# Patient Record
Sex: Female | Born: 1950 | Race: Black or African American | Hispanic: No | Marital: Married
Health system: Southern US, Community
[De-identification: ages and names within clinical notes are randomized; demographics above are authoritative.]

---

## 2007-07-18 ENCOUNTER — Emergency Department: Payer: Self-pay

## 2008-06-05 ENCOUNTER — Ambulatory Visit: Payer: Self-pay | Admitting: Internal Medicine

## 2008-07-20 ENCOUNTER — Ambulatory Visit (HOSPITAL_COMMUNITY): Admission: RE | Admit: 2008-07-20 | Discharge: 2008-07-20 | Payer: Self-pay | Admitting: Ophthalmology

## 2009-05-05 ENCOUNTER — Inpatient Hospital Stay: Payer: Self-pay | Admitting: Internal Medicine

## 2009-06-13 ENCOUNTER — Ambulatory Visit: Payer: Self-pay | Admitting: Ophthalmology

## 2009-06-20 ENCOUNTER — Ambulatory Visit: Payer: Self-pay | Admitting: Ophthalmology

## 2010-04-15 LAB — URINALYSIS, ROUTINE W REFLEX MICROSCOPIC
Bilirubin Urine: NEGATIVE
Ketones, ur: NEGATIVE mg/dL
Leukocytes, UA: NEGATIVE
Nitrite: NEGATIVE
Protein, ur: 100 mg/dL — AB
Urobilinogen, UA: 0.2 mg/dL (ref 0.0–1.0)
pH: 6.5 (ref 5.0–8.0)

## 2010-04-15 LAB — GLUCOSE, CAPILLARY: Glucose-Capillary: 85 mg/dL (ref 70–99)

## 2010-04-15 LAB — COMPREHENSIVE METABOLIC PANEL
ALT: 10 U/L (ref 0–35)
Alkaline Phosphatase: 82 U/L (ref 39–117)
CO2: 28 mEq/L (ref 19–32)
Calcium: 8.9 mg/dL (ref 8.4–10.5)
Chloride: 103 mEq/L (ref 96–112)
GFR calc non Af Amer: >60 mL/min (ref >60)
Glucose, Bld: 151 mg/dL — ABNORMAL HIGH (ref 70–99)
Sodium: 135 mEq/L (ref 135–145)
Total Bilirubin: 0.6 mg/dL (ref 0.3–1.2)

## 2010-04-15 LAB — CBC
Hemoglobin: 12.9 g/dL (ref 12.0–15.0)
MCHC: 34.5 g/dL (ref 30.0–36.0)
RBC: 4.1 MIL/uL (ref 3.87–5.11)
WBC: 7.3 10*3/uL (ref 4.0–10.5)

## 2010-05-22 NOTE — Op Note (Signed)
[name], [name]                ACCOUNT NO.:  [id number]   MEDICAL RECORD NO.:  [id number]          PATIENT TYPE:  AMB   LOCATION:  SDS                          FACILITY:  [facility]   PHYSICIAN:  Lanna Poche, M.D. DATE OF BIRTH:  Jun 25, 1950   DATE OF PROCEDURE:  07/20/2008  DATE OF DISCHARGE:  07/20/2008                               OPERATIVE REPORT   PREOPERATIVE DIAGNOSES:  Proliferative diabetic retinopathy with  preretinal fibrosis, cystic maculopathy, and tractional macular  detachment, right eye.   POSTOPERATIVE DIAGNOSES:  Proliferative diabetic retinopathy with  preretinal fibrosis, cystic maculopathy, and tractional macular  detachment, right eye.   PROCEDURE:  Pars plana vitrectomy, removal of epiretinal membranes,  peeling of internal limiting membranes, and panretinal photocoagulation,  right eye.   SURGEON:  Lanna Poche, MD   ANESTHESIA:  General endotracheal.   ESTIMATED BLOOD LOSS:  Less than 1 mL.   COMPLICATIONS:  None.   OPERATIVE NOTE:  The patient was taken to the operating room where after  induction of general anesthesia, the right eye was prepped and draped in  usual fashion.  Lid speculum was introduced.  Conjunctival peritomy  developed temporally and superonasally.  Hemostasis was obtained with  laser cautery and sclerotomies were fashioned 4 mm posterior to the  limbus at 1:30, 10:30, and 7:30.  The superior sclerotomies were plugged  and a 4-mm infusion cannula was secured at the 7:30 sclerotomy with a  temporary sutures of 7-0 Vicryl.  The tip was visually inspected and  found to be in the vitreous cavity.  Landers ring was secured with 7-0  Vicryl sutures at 3 and 9.  Plugs were removed and 30-degree prismatic  lens was applied to the surface of the eye.  Central core followed by  peripheral vitrectomy was performed.  There was ability to separate the  posterior hyaloid in the far periphery and the posterior and anterior  attachments  were severed 360 degrees.  Focal areas of pinpoint fibrosis  were encountered in the far periphery and these were removed with  vitrector without difficulty.  Magnifying flat lens was applied to the  surface of the eye.  Areas of dense preretinal fibrosis could be seen  along the superonasal and inferotemporal arcades.  There was focal  tractional detachments associated with these as well as there was small  amount of fibrosis on the optic nerve.  The fibrosis on the optic nerve  was separated from the 2 areas, and then these areas were sharply  delaminated from the surface of the retina with the [facility] curved scissors  and then the fibrosis removed.  The small amount of fibrosis on the  optic nerve was peeled off with the serrated forceps.  Inspection of the  macula showed the glistening surface of retina with cystic  decompensation of the fovea.  The MVR blade was used to make a small  incision into the internal limiting membrane.  The internal limiting  membrane peeled off the macula in several small pieces.  The instruments  were removed and eye holes plugged.  The [place] lens  and ring were  removed.  Inspection with an ophthalmoscope and scleral depression  revealed there to be no peripheral retinal breaks or tears.  The  indirect laser was then used to fill in gaps in the far peripheral  panretinal photocoagulation.  The superior sclerotomies were then closed  with 7-0 Vicryl.  The infusion was removed and preplaced suture secured.  Conjunctiva was drawn and reapproximated with interrupted running suture  6-0 plain gut.  Pressure checked with Barraquer tonometer and found lay  at 21.  Subconjunctival space was irrigated with 0.75% Marcaine followed  by  subconjunctival injection of 100 mg of ceftazidime and 10 mg of  Decadron.  Lid speculum was then removed and mixed antibiotic ointment  was applied to the surface of the eye.  An eye patch and shield was then  placed over the  patient's right eye.  The patient tolerated the  procedure well.           ______________________________  Lanna Poche, M.D.     JTH/MEDQ  D:  07/20/2008  T:  07/21/2008  Job:  [id number]

## 2011-06-10 LAB — URINALYSIS, COMPLETE
Ketone: NEGATIVE
Ph: 6 (ref 4.5–8.0)
Specific Gravity: 1.015 (ref 1.003–1.030)
Squamous Epithelial: <1
WBC UR: 1 /HPF (ref 0–5)

## 2011-06-10 LAB — COMPREHENSIVE METABOLIC PANEL
Bilirubin,Total: 0.3 mg/dL (ref 0.2–1.0)
Co2: 23 mmol/L (ref 21–32)
SGOT(AST): 26 U/L (ref 15–37)
SGPT (ALT): 10 U/L — ABNORMAL LOW
Total Protein: 5.8 g/dL — ABNORMAL LOW (ref 6.4–8.2)

## 2011-06-10 LAB — CK TOTAL AND CKMB (NOT AT ARMC): CK-MB: 2.6 ng/mL (ref 0.5–3.6)

## 2011-06-10 LAB — TROPONIN I: Troponin-I: <0.02 ng/mL

## 2011-06-10 LAB — CBC
HGB: 11.9 g/dL — ABNORMAL LOW (ref 12.0–16.0)
MCH: 28.8 pg (ref 26.0–34.0)
MCHC: 33.2 g/dL (ref 32.0–36.0)
MCV: 87 fL (ref 80–100)
Platelet: 208 10*3/uL (ref 150–440)
RDW: 14.8 % — ABNORMAL HIGH (ref 11.5–14.5)
WBC: 10.6 10*3/uL (ref 3.6–11.0)

## 2011-06-11 LAB — HEMOGLOBIN A1C: Hemoglobin A1C: 11.2 % — ABNORMAL HIGH (ref 4.2–6.3)

## 2011-06-12 ENCOUNTER — Inpatient Hospital Stay: Payer: Self-pay | Admitting: Internal Medicine

## 2011-06-13 LAB — PRO B NATRIURETIC PEPTIDE: B-Type Natriuretic Peptide: 8231 pg/mL — ABNORMAL HIGH (ref 0–125)

## 2011-06-14 LAB — CREATININE, SERUM
EGFR (African American): 51 — ABNORMAL LOW
EGFR (Non-African Amer.): 44 — ABNORMAL LOW

## 2011-06-15 LAB — BASIC METABOLIC PANEL
BUN: 12 mg/dL (ref 7–18)
Calcium, Total: 7.7 mg/dL — ABNORMAL LOW (ref 8.5–10.1)
Chloride: 100 mmol/L (ref 98–107)
Creatinine: 1.27 mg/dL (ref 0.60–1.30)
EGFR (African American): 53 — ABNORMAL LOW
EGFR (Non-African Amer.): 46 — ABNORMAL LOW
Osmolality: 282 (ref 275–301)
Potassium: 3.2 mmol/L — ABNORMAL LOW (ref 3.5–5.1)

## 2011-06-15 LAB — OCCULT BLOOD X 1 CARD TO LAB, STOOL: Occult Blood, Feces: NEGATIVE

## 2011-06-16 LAB — WBCS, STOOL

## 2011-06-17 LAB — STOOL CULTURE

## 2011-07-02 ENCOUNTER — Inpatient Hospital Stay: Payer: Self-pay | Admitting: Internal Medicine

## 2011-07-02 LAB — TROPONIN I: Troponin-I: <0.02 ng/mL

## 2011-07-02 LAB — CBC
HGB: 10.4 g/dL — ABNORMAL LOW (ref 12.0–16.0)
MCH: 28.6 pg (ref 26.0–34.0)
MCHC: 32.2 g/dL (ref 32.0–36.0)
Platelet: 276 10*3/uL (ref 150–440)
RDW: 14.6 % — ABNORMAL HIGH (ref 11.5–14.5)

## 2011-07-02 LAB — CK TOTAL AND CKMB (NOT AT ARMC)
CK, Total: 70 U/L (ref 21–215)
CK, Total: 100 U/L (ref 21–215)
CK-MB: 0.7 ng/mL (ref 0.5–3.6)
CK-MB: 0.5 ng/mL (ref 0.5–3.6)

## 2011-07-02 LAB — COMPREHENSIVE METABOLIC PANEL
Alkaline Phosphatase: 135 U/L (ref 50–136)
Bilirubin,Total: 0.3 mg/dL (ref 0.2–1.0)
Chloride: 106 mmol/L (ref 98–107)
Creatinine: 1.23 mg/dL (ref 0.60–1.30)
Glucose: 125 mg/dL — ABNORMAL HIGH (ref 65–99)
SGOT(AST): 15 U/L (ref 15–37)
SGPT (ALT): 8 U/L — ABNORMAL LOW
Total Protein: 6.8 g/dL (ref 6.4–8.2)

## 2011-07-02 LAB — PRO B NATRIURETIC PEPTIDE: B-Type Natriuretic Peptide: 3490 pg/mL — ABNORMAL HIGH (ref 0–125)

## 2011-07-03 LAB — BASIC METABOLIC PANEL
BUN: 15 mg/dL (ref 7–18)
Chloride: 104 mmol/L (ref 98–107)
Co2: 31 mmol/L (ref 21–32)
Creatinine: 1.37 mg/dL — ABNORMAL HIGH (ref 0.60–1.30)
EGFR (Non-African Amer.): 42 — ABNORMAL LOW
Osmolality: 278 (ref 275–301)
Potassium: 4.4 mmol/L (ref 3.5–5.1)
Sodium: 139 mmol/L (ref 136–145)

## 2011-10-31 ENCOUNTER — Ambulatory Visit: Payer: Self-pay | Admitting: Ophthalmology

## 2011-11-06 ENCOUNTER — Ambulatory Visit: Payer: Self-pay | Admitting: Ophthalmology

## 2011-11-06 LAB — LIPID PANEL
Cholesterol: 208 mg/dL — ABNORMAL HIGH (ref 0–200)
HDL Cholesterol: 54 mg/dL (ref 40–60)
Ldl Cholesterol, Calc: 129 mg/dL — ABNORMAL HIGH (ref 0–100)
Triglycerides: 126 mg/dL (ref 0–200)
VLDL Cholesterol, Calc: 25 mg/dL (ref 5–40)

## 2011-11-06 LAB — COMPREHENSIVE METABOLIC PANEL
Alkaline Phosphatase: 163 U/L — ABNORMAL HIGH (ref 50–136)
Anion Gap: 8 (ref 7–16)
BUN: 17 mg/dL (ref 7–18)
Bilirubin,Total: 0.5 mg/dL (ref 0.2–1.0)
Chloride: 104 mmol/L (ref 98–107)
Creatinine: 1.42 mg/dL — ABNORMAL HIGH (ref 0.60–1.30)
EGFR (African American): 46 — ABNORMAL LOW
SGPT (ALT): 16 U/L (ref 12–78)
Total Protein: 7.1 g/dL (ref 6.4–8.2)

## 2011-11-06 LAB — HEMOGLOBIN A1C: Hemoglobin A1C: 10.1 % — ABNORMAL HIGH (ref 4.2–6.3)

## 2011-11-06 LAB — TSH: Thyroid Stimulating Horm: 0.343 u[IU]/mL — ABNORMAL LOW

## 2012-02-05 ENCOUNTER — Ambulatory Visit: Payer: Self-pay | Admitting: Ophthalmology

## 2012-06-05 ENCOUNTER — Ambulatory Visit: Payer: Self-pay | Admitting: Ophthalmology

## 2012-06-05 DIAGNOSIS — Z01810 Encounter for preprocedural cardiovascular examination: Secondary | ICD-10-CM

## 2012-06-05 LAB — HEMOGLOBIN: HGB: 8.7 g/dL — ABNORMAL LOW (ref 12.0–16.0)

## 2012-06-10 ENCOUNTER — Ambulatory Visit: Payer: Self-pay | Admitting: Ophthalmology

## 2012-06-10 LAB — POTASSIUM: Potassium: 5.8 mmol/L — ABNORMAL HIGH (ref 3.5–5.1)

## 2012-06-15 ENCOUNTER — Ambulatory Visit: Payer: Self-pay | Admitting: Ophthalmology

## 2012-09-10 ENCOUNTER — Ambulatory Visit: Payer: Self-pay | Admitting: Internal Medicine

## 2012-10-06 ENCOUNTER — Ambulatory Visit: Payer: Self-pay | Admitting: Internal Medicine

## 2012-10-06 LAB — CBC CANCER CENTER
Eosinophil #: 0.2 x10 3/mm (ref 0.0–0.7)
HGB: 9.7 g/dL — ABNORMAL LOW (ref 12.0–16.0)
Lymphocyte %: 24.1 %
MCHC: 33.3 g/dL (ref 32.0–36.0)
MCV: 90 fL (ref 80–100)
Monocyte #: 0.5 x10 3/mm (ref 0.2–0.9)
Monocyte %: 6 %
Neutrophil #: 5.7 x10 3/mm (ref 1.4–6.5)
Neutrophil %: 66.6 %
RBC: 3.23 10*6/uL — ABNORMAL LOW (ref 3.80–5.20)
RDW: 12.7 % (ref 11.5–14.5)
WBC: 8.6 x10 3/mm (ref 3.6–11.0)

## 2012-10-06 LAB — IRON AND TIBC
Iron Bind.Cap.(Total): 280 ug/dL (ref 250–450)
Iron: 67 ug/dL (ref 50–170)
Unbound Iron-Bind.Cap.: 213 ug/dL

## 2012-10-06 LAB — RETICULOCYTES
Absolute Retic Count: 0.0672 10*6/uL (ref 0.019–0.186)
Reticulocyte: 2.06 % (ref 0.4–3.1)

## 2012-10-06 LAB — CREATININE, SERUM: EGFR (African American): 26 — ABNORMAL LOW

## 2012-10-07 ENCOUNTER — Ambulatory Visit: Payer: Self-pay | Admitting: Internal Medicine

## 2012-10-07 LAB — KAPPA/LAMBDA FREE LIGHT CHAINS (ARMC)

## 2012-10-07 LAB — TSH: Thyroid Stimulating Horm: 1.04 u[IU]/mL

## 2012-10-13 LAB — OCCULT BLOOD X 1 CARD TO LAB, STOOL
Occult Blood, Feces: NEGATIVE
Occult Blood, Feces: NEGATIVE
Occult Blood, Feces: NEGATIVE

## 2012-11-07 ENCOUNTER — Ambulatory Visit: Payer: Self-pay | Admitting: Internal Medicine

## 2012-11-10 LAB — CBC CANCER CENTER
Basophil #: 0 x10 3/mm (ref 0.0–0.1)
Eosinophil %: 2 %
HCT: 29.3 % — ABNORMAL LOW (ref 35.0–47.0)
Lymphocyte #: 1.6 x10 3/mm (ref 1.0–3.6)
Lymphocyte %: 20.6 %
MCH: 30.6 pg (ref 26.0–34.0)
MCHC: 33.6 g/dL (ref 32.0–36.0)
MCV: 91 fL (ref 80–100)
Monocyte #: 0.5 x10 3/mm (ref 0.2–0.9)
Monocyte %: 6.7 %
Neutrophil #: 5.6 x10 3/mm (ref 1.4–6.5)
Neutrophil %: 70.2 %
Platelet: 199 x10 3/mm (ref 150–440)
RBC: 3.22 10*6/uL — ABNORMAL LOW (ref 3.80–5.20)
RDW: 12.5 % (ref 11.5–14.5)

## 2012-11-10 LAB — CREATININE, SERUM: EGFR (Non-African Amer.): 21 — ABNORMAL LOW

## 2012-12-07 ENCOUNTER — Ambulatory Visit: Payer: Self-pay | Admitting: Internal Medicine

## 2013-01-07 DEATH — deceased

## 2014-04-26 NOTE — Op Note (Signed)
PATIENT NAME:  [name], [name] MR#:  [id number] DATE OF BIRTH:  [birth year]  DATE OF PROCEDURE:  11/06/2011  PROCEDURES PERFORMED:  1. Pars plana vitrectomy, left eye.  2. Phacoemulsification and intraocular lens insertion in the left eye.  3. Tractional retinal detachment repair, left eye.  4. Endolaser, left eye.   PREOPERATIVE DIAGNOSES:  1. Dense vitreous hemorrhage.  2. Proliferative diabetic retinopathy.  3. Visually significant cataract.   POSTOPERATIVE DIAGNOSES:  1. Dense vitreous hemorrhage.  2. Proliferative diabetic retinopathy.  3. Visually significant cataract.  4. Tractional retinal detachment   PRIMARY SURGEON: Ignacia Felling. Dakiyah Heinke, MD  ANESTHESIA: Retrobulbar block of the right eye with general endotracheal anesthesia.   COMPLICATIONS: None.   INDICATIONS FOR PROCEDURE: This is a patient who presented to my office with loss of vision in her left eye. Examination revealed a dense hemorrhage in the preretinal area trapped by the vitreous face. Examination of the posterior pole was not possible secondary to the blood. Given that this is the patient's only eye and the very severe loss of vision, risks, benefits, and alternatives of the above procedure were discussed and the patient wished to proceed.   DETAILS OF PROCEDURE: After informed consent was obtained the patient was brought into the operative suite at [facility]. Patient was induced by the anesthesia team without complications and a retrobulbar block was performed on the left eye by the primary surgeon without complication. Left eye was prepped and draped in sterile manner. After lid speculum was inserted, a side-port wound was created at approximately 10:30. DisCoVisc was injected into the anterior chamber to maintain it. Keratome blade was used to create a main corneal wound at approximately 12:00. The anterior capsule was incised with the cystotome and a continuous 360 degree anterior  capsulorrhexis was created without complication. The lens hydrodissected using BSS on a 26-gauge cannula. Lens was rotated for 180 degrees. Phacoemulsification wand was used to break the lens into four quadrants and removed without complication. INA was used to remove any remnant cortical material. The posterior capsule was polished. DisCoVisc was injected into the capsular bag. A 23.5-diopter SN60WF lens, serial #[id number] was injected into the capsular bag and rotated into position. DisCoVisc was removed using INA. A 10-0 stitch was placed at the main corneal wound and rotated into position. The side-port wound was hydrated and the anterior chamber was noted to be filled. The wounds were noted to be watertight and attention was turned to the pars plana vitrectomy portion of the case.   A 25-gauge trocar was placed inferotemporally through displaced conjunctiva in an oblique fashion 3 mm beyond the limbus. The infusion cannula was turned on and inserted through the trocar and secured into position with Steri-Strips. Two more trocars were placed superotemporally and superonasally. The vitreous cutter and light pipe were introduced in the eye and a core vitrectomy was performed. Peripheral vitreous was incised and removed for 360 degrees in order to relieve any anterior-posterior traction. Attention was turned to the posterior portion of the eye. The posterior vitreous face was incised and blood was removed. After this significant proliferative membranes were identified with associated tractional retinal detachments approaching the macula. Each of the proliferative membranes were isolated from one another using a combination of forceps and 25-gauge vitrector. No breaks were created during the course of isolation of each of the membranes. As much of the membranes were removed as safely possible. Once all traction had been relieved, any remnant blood was removed. The  retina was investigated for 360 degrees and no  signs of any breaks or tears could be identified anywhere. Endolaser was introduced and gap laser was performed for 360 degrees in a panretinal fashion. Scleral depression was then performed and no further breaks could identify peripherally. Remnant blood in the inferior was removed as much as possible using scleral depression and direct visualization. An air-fluid exchange was then performed. The trocars were removed and the wounds were noted to be airtight. Pressure in the eye was confirmed to be approximately 15 mmHg. 5 mg of dexamethasone was given into the inferior fornix and the lid speculum was removed. The eye was cleaned and TobraDex was placed in the eye. A patch and shield were placed over the eye and the patient was taken to postanesthesia care with instructions to remain head up.  ____________________________ Ignacia FellingMatthew F. Kayah Hecker, MD mfa:cms D: 11/06/2011 10:34:29 ET T: 11/06/2011 10:57:02 ET  JOB#: [id number] Cline CoolsMATTHEW F Laquinn Shippy MD ELECTRONICALLY SIGNED 12/11/2011 6:55

## 2014-04-29 NOTE — Op Note (Signed)
PATIENT NAME:  Ladonna SnideLSTON, Tiawanna L MR#:  [id number] DATE OF BIRTH:  [birth year]  DATE OF PROCEDURE:  02/05/2012  PROCEDURES PERFORMED:  1. Pars plana vitrectomy of the left eye.  2. Membrane peel of the left eye.  3. Ahmed valve insertion of the left eye.   PREOPERATIVE DIAGNOSES:  1. Nonclearing vitreous hemorrhage.  2. Uncontrolled glaucoma of the left eye.   PRIMARY SURGEON:  1. For pars plana vitrectomy and membrane peel, Ignacia FellingMatthew F. Chantele Corado, MD. 2. For Ahmed valve insertion, Deirdre Evenerhadwick R. Brasington, MD.   ESTIMATED BLOOD LOSS: 1 mL.   COMPLICATIONS: None.   INDICATIONS FOR PROCEDURE: This patient presented to my office several weeks after vitrectomy surgery for proliferative diabetic retinopathy. The patient had repeat vitreous hemorrhage and uncontrolled glaucoma secondary to this. Risks, benefits and alternatives of the above procedure were discussed, and the patient wished proceed.   DETAILS OF PROCEDURE: After informed consent was obtained, the patient brought to the operative suite at [facility]. The patient was placed in supine position and was given a small dose of Alfenta, and a retrobulbar block was performed on the left eye by the primary surgeon without any complications. The left eye was prepped and draped in sterile manner. After lid speculum was inserted, a 100 degree conjunctival peritomy was created superotemporally. A 25-gauge trocar was placed inferotemporally through displaced conjunctiva in an oblique fashion 3 mm beyond the limbus. The infusion cannula was turned on and inserted through the trocar and secured in position with Steri-Strips. Another trocar was placed in a similar fashion superonasally. A trocar was placed obliquely within the area of the conjunctival peritomy superotemporally 3 mm beyond the limbus. The vitreous cutter and light pipe were introduced in the eye, and the blood was removed. Large plaque of blood was noted preretinally.  This was removed. During the course of removal, it became apparent that the patient had had an episode of vitreoschisis, and there was remnant proliferative material and vitreous attached to some old proliferative membranes. This was peeled away and removed. No further areas of traction or remnant vitreous base could be identified. No signs of any breaks, tears or retinal detachment could be identified. Each of the transconjunctival trocars was removed, and the wounds were closed with transconjunctival 6-0 plain gut. The superotemporal trocar was removed, and this was closed using interrupted 7-0 Vicryl. The Ahmed valve insertion portion of the case was then completed by Dr. Inez PilgrimBrasington and will be dictated separately.    ____________________________ Ignacia FellingMatthew F. Champ MungoAppenzeller, MD mfa:OSi D: 02/05/2012 13:42:04 ET T: 02/05/2012 13:56:44 ET JOB#: [id number]  cc: Ignacia FellingMatthew F. Champ MungoAppenzeller, MD, <Dictator> Cline CoolsMATTHEW F Layana Konkel MD ELECTRONICALLY SIGNED 03/03/2012 6:46

## 2014-04-29 NOTE — Op Note (Signed)
PATIENT NAME:  [name], [name] MR#:  [id number] DATE OF BIRTH:  Sep 25, 1950  DATE OF PROCEDURE:  06/15/2012  PROCEDURES PERFORMED: 1.  Pars plana vitrectomy of the left eye.  2.  Panretinal photocoagulation of the left eye.   PREOPERATIVE DIAGNOSES: 1.  Vitreous hemorrhage, left eye. 2.  Proliferative diabetic retinopathy of the left eye.   POSTOPERATIVE DIAGNOSES: 1.  Vitreous hemorrhage, left eye. 2.  Proliferative diabetic retinopathy of the left eye.   ESTIMATED BLOOD LOSS: Less than 1 mL.   PRIMARY SURGEON: Cline CoolsMatthew F Mylani Gentry, M.D.   ANESTHESIA: Retrobulbar block of the left eye with monitored anesthesia care.   COMPLICATIONS: None.   INDICATIONS FOR PROCEDURE: This is a patient who presented to my office with a sudden loss of vision in her only eye. Examination revealed a vitreous hemorrhage. Time was allowed for clearance, which was not successful. Risks, benefits, and alternatives of the above procedure were discussed and the patient wished to proceed.   DETAILS: After informed consent was obtained, the patient was brought into the operative suite at [facility]. The patient was placed in supine position and was given a small dose of Alfenta, and a retrobulbar block was performed on the left eye by the primary surgeon without any complications. The left eye was prepped and draped in sterile manner. After a lid speculum was inserted a 25-gauge trocar was placed inferotemporally through displaced conjunctiva 3 mm beyond the limbus in the inferotemporal quadrant. The infusion cannula was turned on and inserted through the trocar and secured in position with Steri-Strips. Two more trocars were placed in a similar fashion superotemporally and superonasally. The vitreous cutter and light pipe were introduced in the eye and peripheral vitreous was trimmed. Confirmation of no vitreous traction to the posterior was made. The vitreous hemorrhage was removed and   hemorrhage was vacuumed off of the surface of the retina to reveal blood appearing to originate from the original proliferative membrane scars. Endolaser was introduced and cauterization was performed of these proliferative membranes. Laser was then placed in a scatter pattern in areas of laser gap for 360 degrees. A partial air-fluid exchange was performed and the trocars were removed. One single wound was noted to be leaking air, and was closed with a 6-0 plain gut transconjunctivally. Pressure in the eye was confirmed to be approximately 15 mmHg; 5 mg of dexamethasone was given into the inferior fornix. The lid speculum was removed and the eye was cleaned. Cosopt and TobraDex were placed on the eye. A patch and shield were placed over the eye and the patient was taken to postanesthesia care with instructions to remain head-up.      ____________________________ Ignacia FellingMatthew F. Champ MungoAppenzeller, MD mfa:dm D: 06/15/2012 07:59:00 ET T: 06/15/2012 08:22:18 ET JOB#: [id number]  cc: Ignacia FellingMatthew F. Champ MungoAppenzeller, MD, <Dictator> Cline CoolsMATTHEW F Andreyah Natividad MD ELECTRONICALLY SIGNED 07/15/2012 7:19

## 2014-04-29 NOTE — Op Note (Signed)
PATIENT NAME:  [name], [name] MR#:  [id number] DATE OF BIRTH:  [birth year]  DATE OF PROCEDURE:  02/05/2012  LOCATION:  [facility].   PREOPERATIVE DIAGNOSES:   1.  Uncontrolled primary open angle glaucoma, left eye.  2.  Recurrent vitreous hemorrhage.   POSTOPERATIVE DIAGNOSES:   1.  Uncontrolled primary open angle glaucoma, left eye.  2.  Recurrent vitreous hemorrhage.   PROCEDURE:   1.  Aqueous shunt placement to external reservoir of the left eye using Ahmed glaucoma valve and Tutoplast.  2.  Pars plana vitrectomy performed by Dr. Champ Mungo and dictated separately.   IMPLANTS:  Glaucoma implant Ahmed glaucoma valve, serial number [id number], model FP7.   OTHER IMPLANT:  Tutoplast allograft, reference [id number], lot #[id number].   SURGEON:  Lockie Mola, MD   ANESTHESIA:  Retrobulbar block.   COMPLICATIONS:  None.   DESCRIPTION OF PROCEDURE:  The patient was identified in the holding room and transported to the operating suite and placed in the supine position underneath the operating microscope. The left eye was identified as the operative eye and a retrobulbar block of Xylocaine and bupivacaine was administered under intravenous sedation. It was then prepped and draped in the usual sterile ophthalmic fashion. Dr. Champ Mungo performed a pars plana vitrectomy in its entirety prior to the Ahmed glaucoma valve placement which was performed by me in its entirety. Dr. Champ Mungo left the conjunctival peritomy in the superotemporal quadrant open for me to begin the glaucoma valve insertion.   There was a conjunctival peritomy present from the 12 o'clock to 3 o'clock position approximately 3 mm posterior to the limbus. Relaxing incisions were made to extend this to the insertions of the lateral rectus and superior rectus muscles. Hemostasis was achieved with wet field cautery. [place] scissors were used with blunt and sharp dissection to dissect a scleral pocket underneath  conjunctiva and Tenons in the superotemporal quadrant. A muscle hook was used to isolate the superior rectus muscle. A 5-0 silk bridle suture was used to isolate this muscle and provide traction throughout the procedure. A muscle hook was used to isolate the lateral rectus muscle and a 5-0 silk bridle suture was passed which was used to provide traction throughout the procedure.   A caliper was used to measure a position 10 mm posterior to the limbus in the superotemporal quadrant. Two 8-0 nylon sutures were placed at this distance in order to anchor the Ahmed glaucoma valve, model FP7, serial [id number]. The Ahmed glaucoma valve was inspected and its tube was cannulated with a 27-gauge cannula with balanced salt solution. Balanced salt solution was then injected into the glaucoma valve to prime it. It was found to be in good working condition. The Ahmed valve was then placed into the superotemporal pocket underneath the conjunctiva and Tenons. It was sutured with the preplaced 8-0 nylon sutures at a position 10 mm posterior to the limbus. This was verified after its placement with a caliper. The tube end was then cut with an anterior bevel to position into the anterior chamber. A paracentesis incision was made through clear cornea at the 3 o'clock position. The anterior chamber was filled with Provisc. A 22-gauge needle was then used to enter the anterior chamber in the superotemporal quadrant near the limbus. The tube was then placed into the anterior chamber parallel with the iris. There was no corneal or iris touch. An 8-0 nylon suture was used to secure the length of the tube to the sclera. A Tutoplast graft was  then cut to fit, 0.6 x 1.0 cm, reference #[id number], lot #[id number] from the limbus over the tube to the area of the plate. This was secured with 4 interrupted 8-0 nylon sutures. The anterior chamber was filled with balanced salt solution and the incision was noted to be watertight. Provisc was evacuated  from the eye during this. The eye was noted to lower to a low physiologic pressure after being instilled with balanced salt solution. The conjunctiva and Tenons were closed at the limbus using running 9-0 Vicryl suture. Additional balanced salt solution was placed into the anterior chamber.  The eye had a low physiologic pressure of approximately 5 mmHg. Therefore, the anterior chamber was filled with [place] viscoelastic to ensure that the pressure would not remain too low in the early postoperative period.  There were no wound leaks noted. The conjunctival incision was tight and there was bleb formation. Topical Vigamox drops and Maxitrol ointment were applied to the eye. The eye was patched and shielded. The patient was taken to the recovery in stable condition.   .    ____________________________ Deirdre Evenerhadwick R. Sherryl Valido, MD crb:si D: 02/05/2012 15:04:33 ET T: 02/05/2012 16:19:07 ET JOB#: [id number]  cc: Deirdre Evenerhadwick R. Charlyne Robertshaw, MD, <Dictator> Lockie MolaHADWICK Harli Engelken MD ELECTRONICALLY SIGNED 02/12/2012 10:08

## 2014-05-01 NOTE — Discharge Summary (Signed)
PATIENT NAME:  [name], [name] MR#:  [id number] DATE OF BIRTH:  Feb 22, 1950  DATE OF ADMISSION:  07/02/2011 DATE OF DISCHARGE:  07/03/2011  ADMISSION DIAGNOSIS: Shortness of breath.  DISCHARGE DIAGNOSES: 1. Acute respiratory failure, likely secondary to acute on chronic congestive heart failure, diastolic dysfunction and community-acquired pneumonia.  2. Acute on chronic diastolic heart failure.  3. Community-acquired pneumonia.  4. History of hypertension.  5. History of diabetes.   LABORATORY, DIAGNOSTIC AND RADIOLOGICAL DATA: Pertinent laboratories at discharge: Sodium 139, potassium 4.4, chloride 104, bicarbonate 31, BUN 15, creatinine 1.37, glucose 92. Troponins x3 were negative.   Lower extremity Doppler's were negative to date.   2-D echocardiogram showed ejection fraction of 50%, mild MR and TR. Chest x-ray shows pulmonary edema.   HOSPITAL COURSE: 64 year old female presented with shortness of breath, found to have some pulmonary edema as well as community-acquired pneumonia. For further details, please refer to the history and physical.  1. Acute respiratory failure, likely from congestive heart failure, diastolic, acute on chronic with ejection fraction of 50% and community-acquired pneumonia. Lower extremity Doppler for pulmonary emboli although patient had an elevated d-dimer. Her chest x-ray was consistent with pneumonia as well as congestive heart failure. Suspicion was low for pulmonary emboli as she is not hypoxic or tachycardic.  2. Congestive heart failure, acute on chronic, ejection fraction of 50%. She had some diastolic dysfunction, improved with Lasix. Her lower extremity edema is also improved. She diuresed well. She will continue on a very low dose Lasix. Her creatinine is up 1.37 so we recommend holding this for two days and then may resume. 3. Community-acquired pneumonia. Patient will be treated with Levaquin for five days total. 4. Hypertension. Continue current  medications.  5. Diabetes. Continue Lantus, ADA diet.  6. Hyperlipidemia. On atorvastatin.   DISCHARGE MEDICATIONS:  1. Aspirin 81 mg daily.  2. Fluoxetine 60 mg daily.  3. Lantus 20 units at bedtime.  4. Tylenol 325, 2 tablets every four hours p.r.n. pain.  5. Atenolol 25 mg daily.  6. Lasix 20 mg daily as needed for swelling; may start on 07/06/2011 if needed. 7. Losartan 50 mg daily.  8. NovoLog sliding scale. 9. Atorvastatin 10 mg at bedtime.   10. NovoLog 2 units t.i.d. before meals.  11. Levaquin 750 mg q.8 hours for five days. 12. Nicotine patch 7 mg/24 hours.   DISCHARGE DIET: ADA low sodium diet.   DISCHARGE ACTIVITY: As tolerated.   DISCHARGE FOLLOW UP: Patient will need to follow up with Dr. Terance HartBronstein in 2 to 3 days.   TIME SPENT: Approximately 35 minutes.  ____________________________ Janyth ContesSital P. Juliene PinaMody, MD spm:cms D: 07/03/2011 11:20:47 ET T: 07/03/2011 11:40:20 ET JOB#: [id number]  cc: Brentyn Seehafer P. Juliene PinaMody, MD, <Dictator> Teena Iraniavid M. Terance HartBronstein, MD  Janyth ContesSITAL P Saagar Tortorella MD ELECTRONICALLY SIGNED 07/03/2011 11:52

## 2014-05-01 NOTE — H&P (Signed)
PATIENT NAME:  [name], [name] MR#:  [id number] DATE OF BIRTH:  16-Jun-1950  DATE OF ADMISSION:  06/11/2011  PRIMARY CARE PHYSICIAN: None local.   REFERRING PHYSICIAN: Dr. Sharma CovertNorman   CHIEF COMPLAINT: Unresponsiveness today.   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 The patient now is alert, awake. She complains of headache, dizziness, generalized weakness, and nausea but denies any other symptoms. She does not know what happened to her but according to her she did not eat this morning after injection with Lantus and then was found unresponsive by her husband who called EMS. The patient's blood sugar was in 20's and was treated with Glucagon by EMS. Blood sugar increased to 40. She was sent to the ED and blood sugar was about 50 and was treated with D50, increased to 100 but it dropped again to 50's and then was treated with D50 again. Blood sugar increased to about 100 but the patient is lethargic and confused according to Dr. Sharma CovertNorman. She is admitted for hypoglycemia.   PAST MEDICAL HISTORY: As mentioned above:  1. Hypertension.  2. Diabetes.  3. Diabetic neuropathy. 4. Depression.  5. Right eye legally blind.   SOCIAL HISTORY: The patient said she smoked about four cigarettes a day for many years. Denies any alcohol drinking or illicit drugs. She is living in a trailer with her husband.   FAMILY HISTORY: Mother had history of diabetes, congestive heart failure, and hypertension.   ALLERGIES: None.   MEDICATIONS:  1. Aspirin 81 mg p.o. daily.  2. Atenolol 25 mg p.o. daily. 3. Bactroban one application topical once daily.  4. Cozaar 50 mg p.o. daily.  5. HCTZ 25 mg p.o. daily. 6. Keflex 500 mg p.o. b.i.d. for five days. 7. Lantus. The patient cannot remember how many  units. 8. Lipitor.  9. Metformin 500 mg p.o. 2 tablets once daily.  10. Percocet 5/325 one tablet q.4 hours p.r.n.  11. Prozac 1 tablet p.o. daily.  12. Lantus 100 units sub-Q once daily.   REVIEW OF SYSTEMS: CONSTITUTIONAL: The patient denies any fever or chills but has a headache, dizziness, weakness. EYES: No double vision, blurred vision but has right eye blindness. ENT: No postnasal drip, epistaxis, or sore throat. CARDIOVASCULAR: No chest pain, palpitation, orthopnea, or nocturnal dyspnea. No leg edema. PULMONARY: No cough, sputum, shortness of breath, or hemoptysis. GI: No abdominal pain, vomiting, or diarrhea. No melena or bloody stool but has nausea. ENDOCRINE: No polyuria, polydipsia, heat or cold intolerance. SKIN: No rash or jaundice. GU: No dysuria, hematuria, or incontinence. NEUROLOGY: Positive for unresponsiveness but denies any syncope, loss of consciousness, or seizure.   PHYSICAL EXAMINATION:   VITAL SIGNS: Temperature 98.1, blood pressure 121/61 and just now it was 168/80, respirations 20, oxygen saturation 94% on room air.  GENERAL: This patient is awake, alert, oriented in no acute distress but obese.   ENT: Right eye blind. Left eye pupil round, equal, reactive to light. Moist oral mucosa. Clear oropharynx.   NECK: Supple. No JVD or carotid bruit. No lymphadenopathy. No thyromegaly.   PULMONARY: Bilateral air entry. No wheezing or rales.   ABDOMEN: Soft, obese. Bowel sounds present. No organomegaly. No tenderness or distention.   EXTREMITIES: No edema, clubbing, or cyanosis. No calf tenderness. Strong bilateral pedal pulses. There is a healing ulcer on the  bottom of the right foot.   NEUROLOGY: Alert and oriented x3. No focal deficit. Power 5 out of 5. Sensation intact. Deep tendon reflexes mute.   LABORATORY, DIAGNOSTIC, AND RADIOLOGICAL DATA: Blood glucose 110. Urinalysis negative.   CAT scan of head no evidence of acute ischemic or hemorrhagic infarction.    Chest x-ray may reflect low-grade CHF but hypoinflation and the patient's body habitus limits the study.   WBC 10.6, hemoglobin 11.9, platelets 208, glucose 53, BUN 9, creatinine 0.79, sodium 144, potassium 3.7, chloride 110, bicarb 23. Troponin less than 0.02. CK-MB 2.6. CK 139.   EKG sinus tachycardia at 102 beats per minute with low voltage QRS.   IMPRESSION:  1. Hypoglycemia.  2. Altered mental status due to hypoglycemia.  3. Diabetes, uncontrolled.  4. Hypertension, controlled.  5. Morbid obesity.  6. History of depression.   PLAN OF TREATMENT:  1. The patient and will be placed for observation. 2. We will start hold Lantus and metformin and start Accu-Cheks, sliding scale. May add Lantus depending on the patient's Accu-Chek. For now will give D5 half normal saline IV and also check hemoglobin A1c and a lipid panel.  7. We will continue Cozaar, atenolol, and HCTZ.  8. Fall and aspiration precautions.  9. GI and DVT prophylaxis.   I discussed the patient's situation and plan of treatment with the patient.   TIME SPENT: About 60 minutes.   ____________________________ Shaune Pollack, MD qc:drc D: 06/10/2011 23:10:44 ET T: 06/11/2011 08:32:09 ET JOB#: [id number]  cc: Shaune Pollack, MD, <Dictator> Shaune Pollack MD ELECTRONICALLY SIGNED 06/18/2011 17:50

## 2014-05-01 NOTE — H&P (Signed)
PATIENT NAME:  [name], [name] MR#:  [id number] DATE OF BIRTH:  04/25/1950  DATE OF ADMISSION:  07/02/2011  PRIMARY CARE PHYSICIAN: Dr. Terance Hart.   CHIEF COMPLAINT: Chest pressure and shortness of breath.   HISTORY OF PRESENT ILLNESS: The patient is a 64 year old female who was just discharged from the hospitalist service in early June who comes in with the above complaint. The patient is a resident of [facility]. This morning she woke up acutely with shortness of breath, dyspnea and a tightness in her chest. She says that she has had on and off symptoms similar to this over the past week. It is worse with activity and better with oxygen. She also noted increasing lower extremity edema about three weeks ago. About two weeks ago she was started on Lasix 20 mg daily. She is ambulatory. No recent surgery. She has been hospitalized in early June.   REVIEW OF SYSTEMS:  CONSTITUTIONAL: No fever. Positive fatigue and weakness. EYES: No blurred or double vision. She is blind in her right eye. ENT: No ear pain, hearing loss, or seasonal allergies. RESPIRATORY: No cough. Positive wheezing. Positive dyspnea. Positive snoring. CARDIOVASCULAR: Chest pressure. No orthopnea. Positive edema. No arrhythmia. Positive mild dyspnea on exertion. No palpitations or syncope. GASTROINTESTINAL: No nausea, vomiting, diarrhea, abdominal pain, melena, or ulcers. GU: No dysuria or hematuria. ENDOCRINE: No polyuria or polydipsia. HEME/LYMPH: No anemia or easy bruising. SKIN: No rash or lesions. MUSCULOSKELETAL: Some limited activity due to lower extremity edema. NEUROLOGIC: No history of cerebrovascular accident or transient ischemic attack. PSYCH: Positive depression.   PAST MEDICAL HISTORY:  1. Hypertension.  2. Diabetes.  3. Diabetic neuropathy and right eye blindness from diabetes.  4. Depression.  5. Hyperlipidemia.   MEDICATIONS:  1. Atenolol 25 mg daily.  2. Paxil 60 mg daily.  3. Losartan 50 mg daily.   4. Atorvastatin 10 mg daily.  5. Lantus 25 units at bedtime.  6. Aspirin 81 mg daily.  7. Oxygen 2 liters continuous.  8. Metformin 500 b.i.d.  9. Lasix 20 mg daily.   SOCIAL HISTORY: The patient quit smoking about two weeks ago. She is smoking one cigarette a day. No alcohol or IV drug use. She is a resident of [facility].   FAMILY HISTORY: Positive for congestive heart failure, hypertension, diabetes.   ALLERGIES: No known drug allergies.   PAST SURGICAL HISTORY: None.   PHYSICAL EXAMINATION:    VITAL SIGNS: Temperature 97.4, pulse 70, respirations 20, blood pressure 157/95, 100% on room air.   GENERAL: The patient is alert, oriented, not in acute distress.   HEENT: Head is atraumatic. Pupils are round and reactive. The patient is legally blind in the right eye. Sclerae anicteric. Mucous membranes are moist. Oropharynx is clear.    NECK: Supple without jugular venous distention, carotid bruit, or enlarged thyroid.   HEART: Regular rate and rhythm. No murmurs, gallops, or rubs. PMI is not displaced.   LUNGS: Clear to auscultation without crackles, rales, rhonchi, or wheezing. Normal percussion.   BACK: No CVA or vertebral tenderness.   ABDOMEN: Obese. Bowel sounds are positive. Nontender. Hard to appreciate organomegaly due to body habitus.   EXTREMITIES: 2+ pitting edema bilaterally and symmetrically.   NEUROLOGIC: Cranial nerves 2 through 12 are grossly intact. There are no focal deficits. The patient is legally blind.   MUSCULOSKELETAL: Five out of five strength in all extremities.   SKIN: Without rash or lesions.   LABORATORY, RADIOLOGICAL AND DIAGNOSTIC DATA: pH 7.43, pCO2  40, pO2 89, oxygen saturation 96.8 on room air. BNP is 3,490. CK 70, CPK-MB 0.7, sodium 138, potassium 4.6, chloride 106, bicarbonate 22, BUN 12, creatinine 1.23, glucose 125, calcium 8.1, bilirubin 0.3, alkaline phosphatase 135, AST 15, ALT 8, total protein 6.8, albumin 2.2. White blood  cell 7, hemoglobin 10.4, hematocrit 33, platelets 276, troponin less than 0.02. D-dimer is elevated at 2.09. Chest x-ray appears to have some pulmonary edema. EKG: Normal sinus rhythm without ST elevation or depression.   ASSESSMENT AND PLAN: This is a 64 year old female who presented with shortness of breath.  1. Shortness of br80eath likely due to congestive heart failure. The chest x-ray does show some pulmonary edema. However, she has an elevated d-dimer. Will go ahead and order a CT scan to rule out pulmonary emboli.  2. Acute pulmonary edema likely from congestive heart failure. Will order echocardiogram. The patient has no known history of congestive heart failure and will also order Lasix.  3. Hypertension. Will continue atenolol and losartan.  4. Depression. Continue fluoxetine.  5. Hyperlipidemia. Continue atorvastatin.  6. Diabetes. Will continue Lantus, ADA diet, sliding scale insulin.  7. CODE STATUS: The patient is FULL CODE status.   The plan of care was discussed with the patient.   TIME SPENT: Approximately 55 minutes.   ____________________________ Janyth ContesSital P. Juliene PinaMody, MD spm:ap D: 07/02/2011 09:32:36 ET             T: 07/02/2011 10:33:08 ET                    JOB#: [id number] cc: Aidyn Kellis P. Juliene PinaMody, MD, <Dictator> Teena Iraniavid M. Terance HartBronstein, MD Janyth ContesSITAL P Alice Burnside MD ELECTRONICALLY SIGNED 07/02/2011 13:58

## 2014-05-01 NOTE — Discharge Summary (Signed)
PATIENT NAME:  [name], [name] DATE OF BIRTH:  04-01-1950  DATE OF ADMISSION:  06/12/2011 DATE OF DISCHARGE:  06/17/2011  ADMITTING DIAGNOSES: Confusion, hypoglycemia, generalized weakness.   DISCHARGE DIAGNOSES:  1. Acute encephalopathy felt to be metabolic due to hypoglycemia, now resolved.  2. Hypoglycemia. No further hypoglycemia, has hyperglycemia. Patient resumed on Lantus.  3. Generalized weakening and deconditioning. Patient being discharged to [facility].  4. Hypertension.  5. Right foot callus. Will need to follow up outpatient with podiatry.  6. Morbid obesity.  7. Some mild drainage from abdominal wall. Computed tomography of the abdomen and pelvis shows no fistula, no further drainage.  8. Shortness of breath, possibly due to volume overload, resolved with one dose Lasix.  9. Nausea, diarrhea, possibly related to the oral contrast, now resolved. Stool studies negative,  10. Chronic eye problems. Patient needs to follow up with her ophthalmologist as an outpatient.  11. Diabetic neuropathy.  12. Depression.  13. Right eye legally blind.  14. Bilateral effusions possibly related to volume overload. Patient currently asymptomatic. If becomes symptomatic will need further evaluation for these.  LABORATORY, DIAGNOSTIC AND RADIOLOGICAL DATA: Admitting glucose 53, BUN 9, creatinine 0.79, sodium 144, potassium 3.7, chloride 110, CO2 13, anion gap 11, calcium 7.8. Hemoglobin A1c 11.2. LFTs showed total protein 5.8, albumin 1.7, bilirubin total 0.3, alkaline phosphatase 144, AST 10. CPK 139. Troponin less than 0.02. WBC 10.6, hemoglobin 11.9, platelet count 208. Urinalysis was nitrites negative, leukocytes negative. EKG showed normal sinus rhythm without any ST-T wave changes with sinus tachycardia Stool for C. difficile negative. Stool cultures negative for Salmonella, Shigella. No Campylobacter or any other pathogens. Stool guaiac was negative. CT of the abdomen and  pelvis with contrast showed large lower abdominal and upper pelvic ventral hernia containing portions of the transverse colon. No evidence of obstruction. Small right pleural effusion, moderate-sized left pleural effusion with possible left basilar atelectasis. CT scan of the head shows no acute ischemic infarct. PA and lateral chest x-ray shows bilateral interstitial thickening.    CONSULTANT: Case management.   HOSPITAL COURSE: Please refer to history and physical done by the admitting physician. Patient is a 64 year old African American female who resides with her husband at home has history of hypertension, diabetes, diabetic neuropathy, depression, also has cataracts, legally blind in the right eye who was brought to the ED with decrease in responsiveness. Patient was noted to have blood sugars in the 20s. Was treated with glucagon by EMS. She was brought to the ED. In the ED her mental status did improve. She was started on IV fluids containing D5. In terms of her blood sugars, as patient started becoming more awake and alert she started eating more and her blood glucose started going up in the 200s. She is back on her Lantus. She has not had any further drop in her hemoglobin. This will closely need to be monitored. It is likely that she was not eating adequate at home and thus what caused the hypoglycemia. Her sugars are under poor control at home with her hemoglobin A1c being 11.5. In terms of her acute encephalopathy which was felt to be metabolic, her mental status is currently back to baseline. She has also developed some hypoxia and was given Lasix with resolution of her symptoms. Her CT scan showed incidental bilateral pulmonary effusions, possibly related to volume overload. She will need to follow this up. If persists and if she is symptomatic will need to have these  drained. Patient also was having some drainage from her abdomen. She does have large ventral hernia. Had a CT scan of the abdomen  which failed to show any abnormality or fistula. She has not had any further drainage. She also developed diarrhea after getting p.o. contrast for the CT. Stool studies have been negative. Likely due to the contrast itself causing the symptoms. At this time patient is fairly stable for discharge.   DISCHARGE MEDICATIONS:  1. Fluoxetine 60 daily.  2. Aspirin 81 mg 1 tab daily.  3. Lantus 20 units at bedtime.  4. Sliding scale insulin as per Morris.  5. NovoLog insulin 2 units before meals. 6. Cozaar 50 mg p.o. daily. 7. Simvastatin 10 p.o. daily.  8. Tylenol 650 p.o. every six hours p.r.n. pain. 9. Atenolol 25 p.o. daily.  10. Lasix 20 mg p.o. daily p.r.n. swelling.   HOME OXYGEN: None.   DIET: Low sodium.   ACTIVITY: As tolerated. Physical therapy evaluation and treatment.   REFERRAL: [facility].  FOLLOW UP: Follow up 1 to 2 weeks with primary M.D. Also follow up with podiatry in 2 to 4 weeks. Patient to have Accu-Cheks before meals and at bedtime. Also primary physician needs to follow up chest x-ray. If pleural effusion persists need to have further evaluation.   TIME SPENT: 35 minutes.   ____________________________ Lacie Scotts Allena Katz, MD shp:cms D: 06/17/2011 13:25:09 ET T: 06/17/2011 13:47:27 ET JOB#: [id number]  cc: Darionna Banke H. Allena Katz, MD, <Dictator> Charise Carwin MD ELECTRONICALLY SIGNED 06/26/2011 11:39

## 2014-05-30 IMAGING — CT CT HEAD WITHOUT CONTRAST
2 of 3 series · 16 of 30 positions shown, 18 images · non-contrast
Comparison: none

REASON FOR EXAM: headache hypoglycemia
COMMENTS:

[Series 2: without · axial · non-contrast · 0.43mm/px · z∈[-142,-22]mm · 8 of 32 slices shown, 10 images]
[im 4/32  brain]
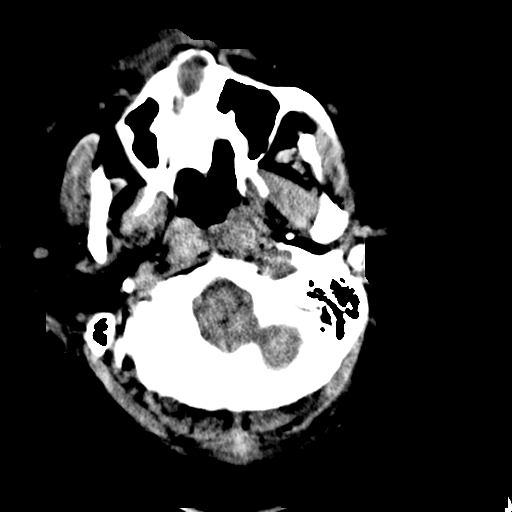
[im 4/32  bone]
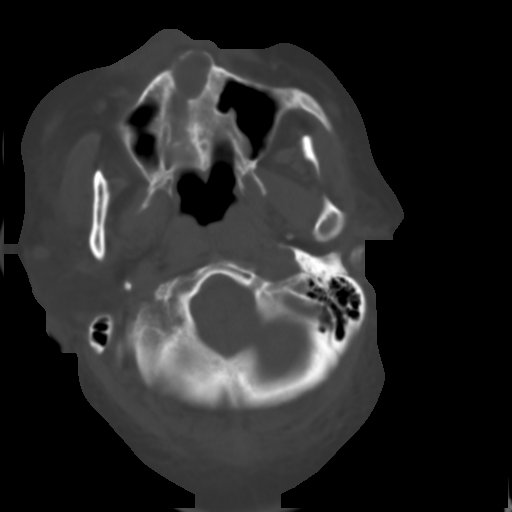
[im 7/32  brain]
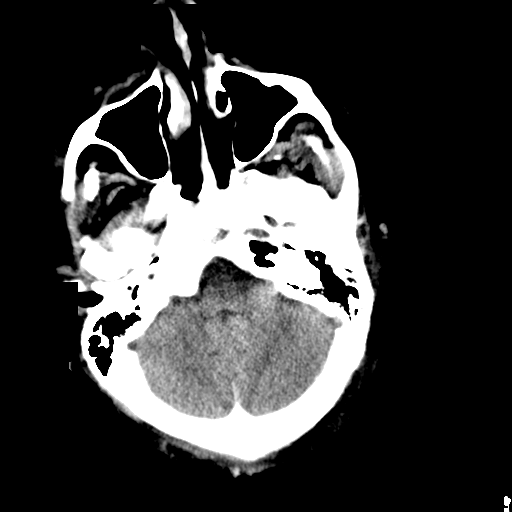
[im 11/32  brain]
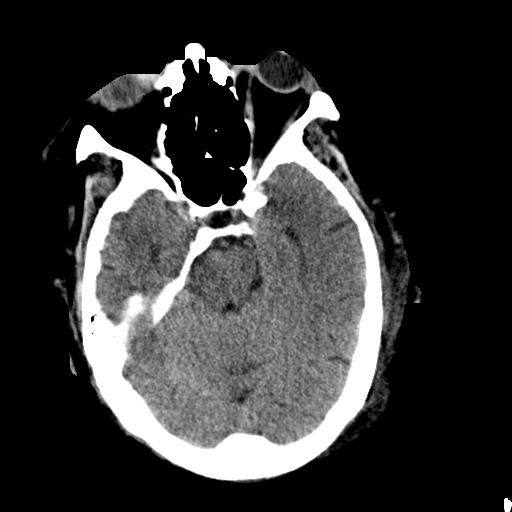
[im 14/32  brain]
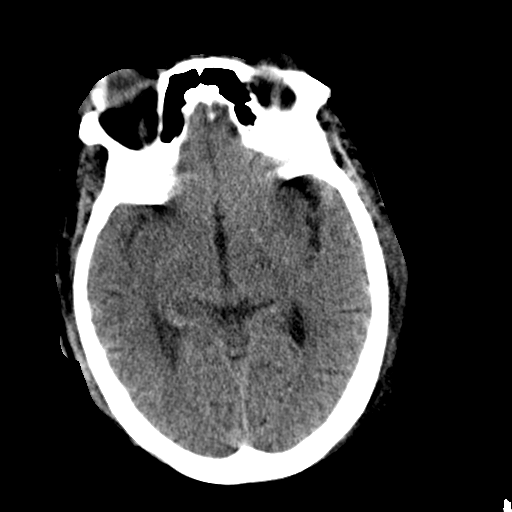
[im 18/32  brain]
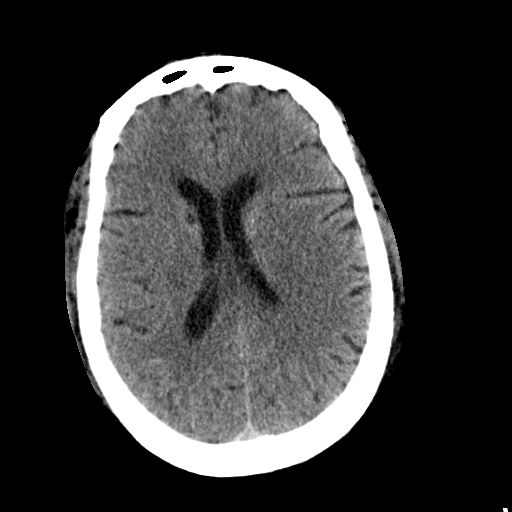
[im 18/32  bone]
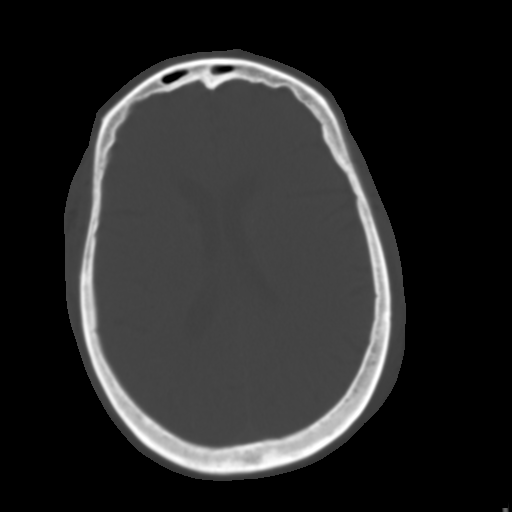
[im 21/32  brain]
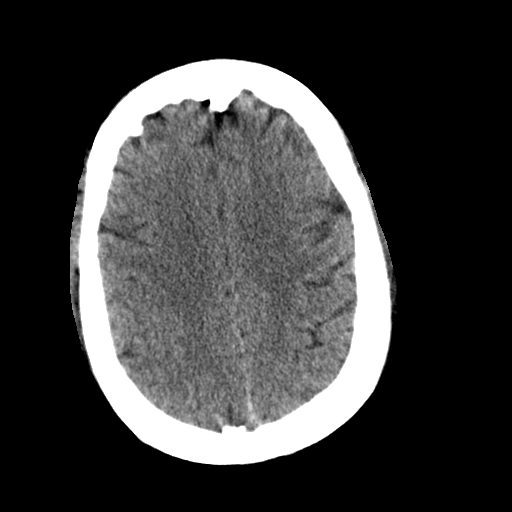
[im 25/32  brain]
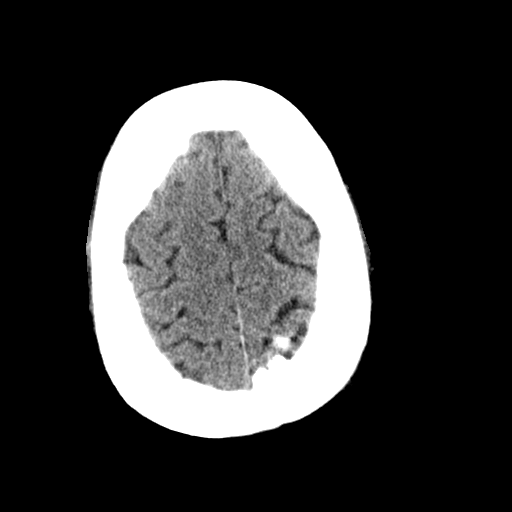
[im 28/32  brain]
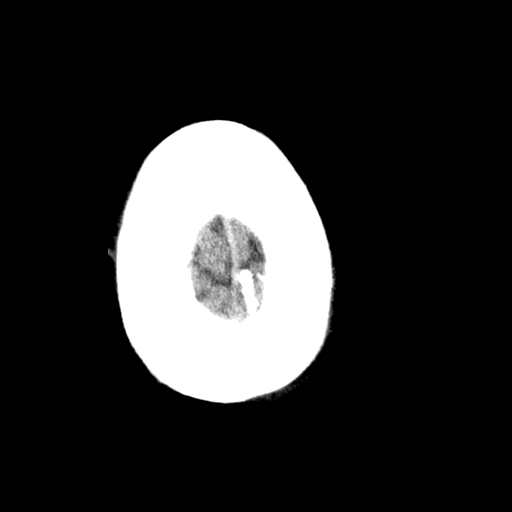

[Series 4: (id) · axial · 0.43mm/px · z∈[-102,+17]mm · 8 of 33 slices shown]
[im 4/33  brain]
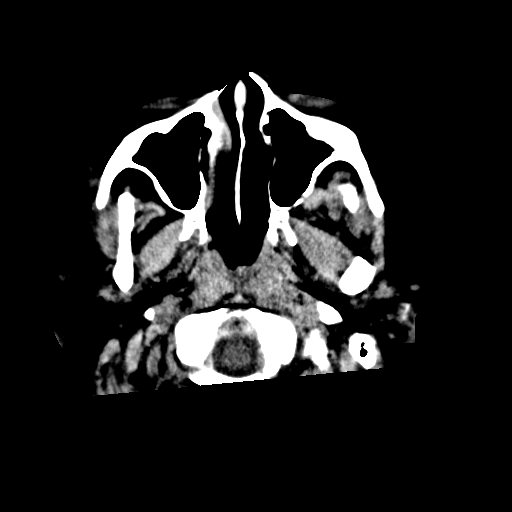
[im 8/33  brain]
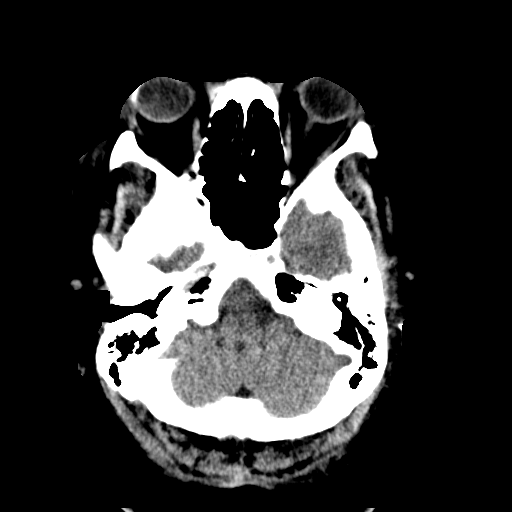
[im 11/33  brain]
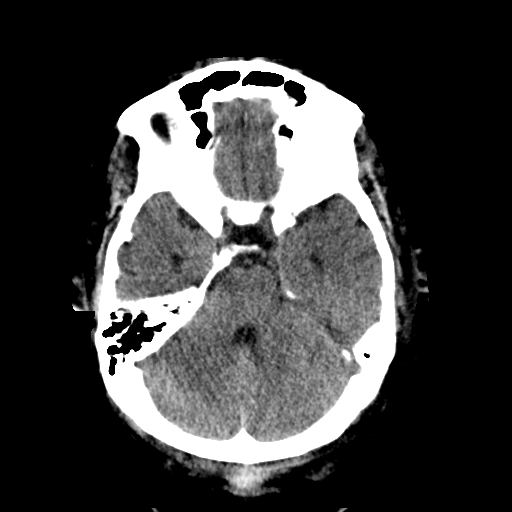
[im 15/33  brain]
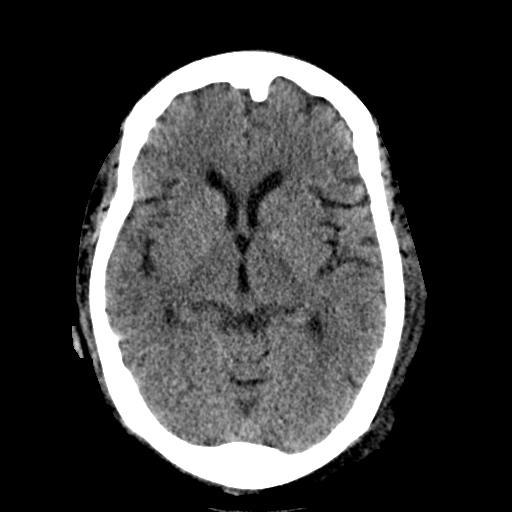
[im 18/33  brain]
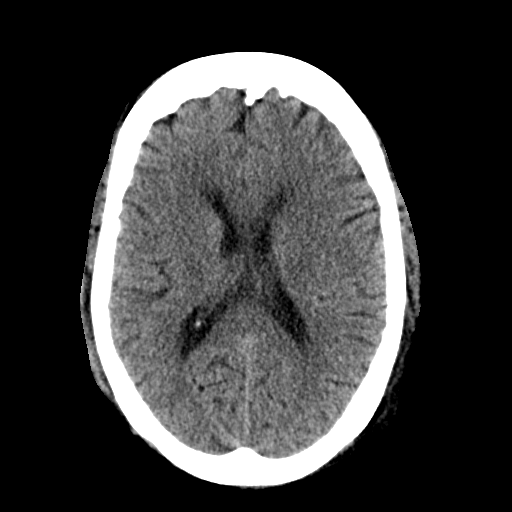
[im 22/33  brain]
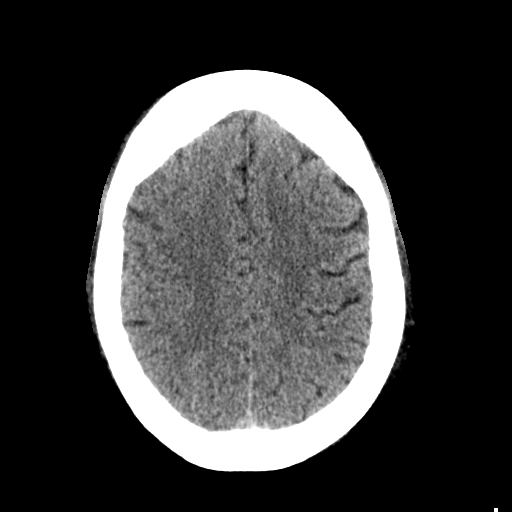
[im 25/33  brain]
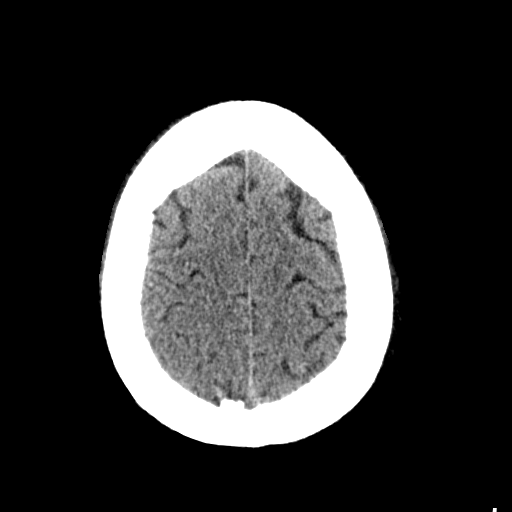
[im 29/33  brain]
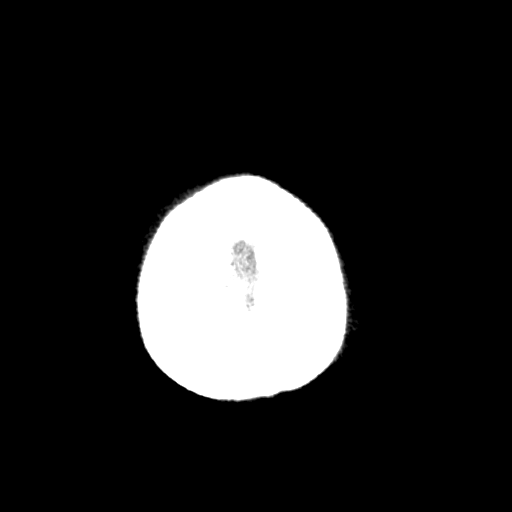

[16 of 30 positions shown; findings below may reference images not displayed]

PROCEDURE:     CT  - CT HEAD WITHOUT CONTRAST  - June 10, 2011  [DATE]

RESULT:     Axial noncontrast CT scanning was performed through the brain
with reconstructions at 5 mm intervals and slice thicknesses.

There is mild diffuse cerebral atrophy. There is an old lacunar infarction
in the right caudate nucleus. There are basal ganglia calcifications
bilaterally. There is no evidence of an acute ischemic infarction nor acute
intracranial hemorrhage. The cerebellum and brainstem exhibit no acute
abnormality.

At bone window settings I do not see evidence of an acute skull fracture.
The observed portions of the paranasal sinuses are clear.
IMPRESSION: 1. I do not see evidence of an acute ischemic or hemorrhagic infarction.
2. There is no intracranial mass effect or hydrocephalus.
3. There is an old tiny lacunar infarction in the right caudate nucleus.

[REDACTED]

## 2014-06-02 IMAGING — CT CT ABD-PELV W/ CM
1 of 2 series · 15 of 32 positions shown, 19 images · non-contrast
Comparison: none

REASON FOR EXAM: (1) draining area lower abdomen; (2) r/o fistula;
NOTE: Nursing to Give Oral
COMMENTS:

[Series 3: 3mm soft tissue · axial · 1.27mm/px · z∈[+490,+926]mm · 15 of 159 slices shown, 19 images]
[im 7/159  soft-tissue]
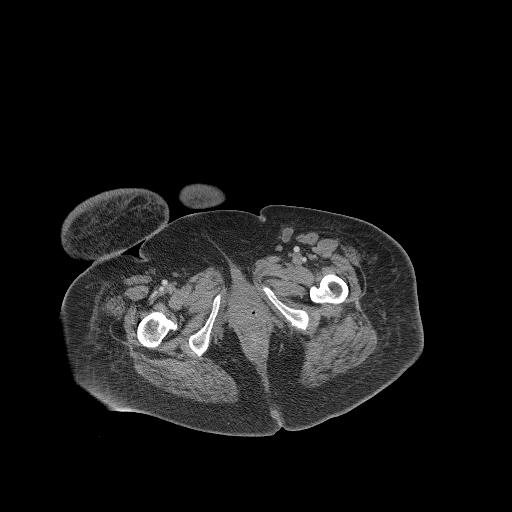
[im 7/159  bone]
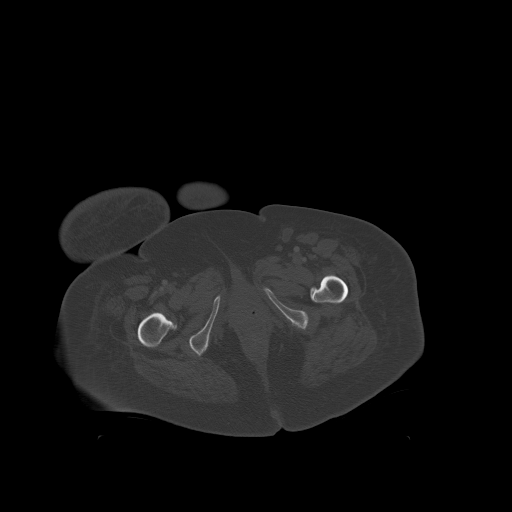
[im 20/159  soft-tissue]
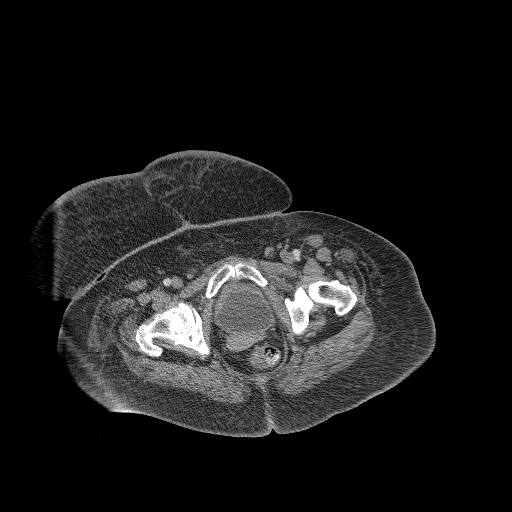
[im 33/159  soft-tissue]
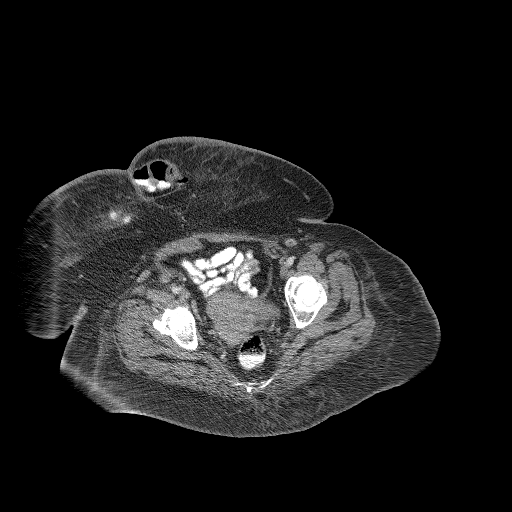
[im 47/159  soft-tissue]
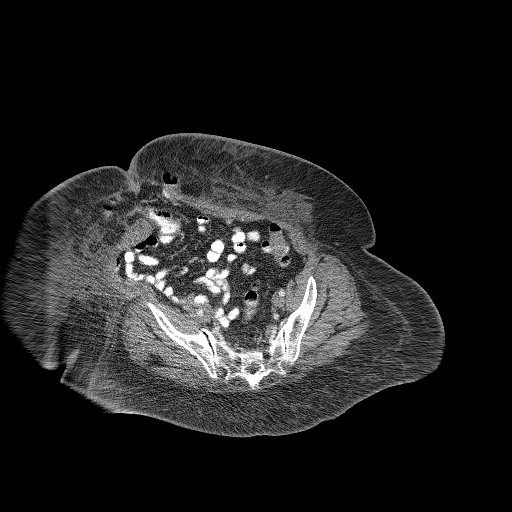
[im 53/159  soft-tissue]
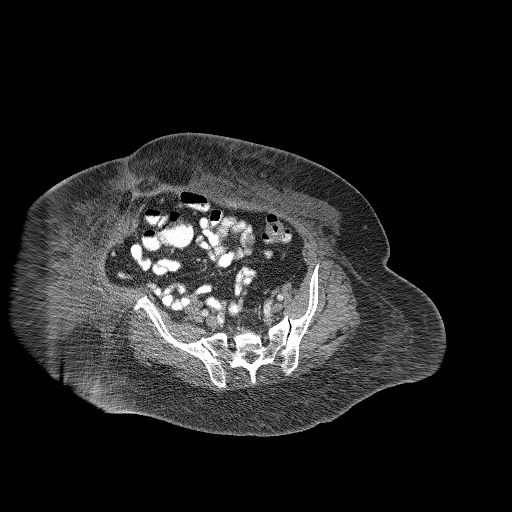
[im 66/159  soft-tissue]
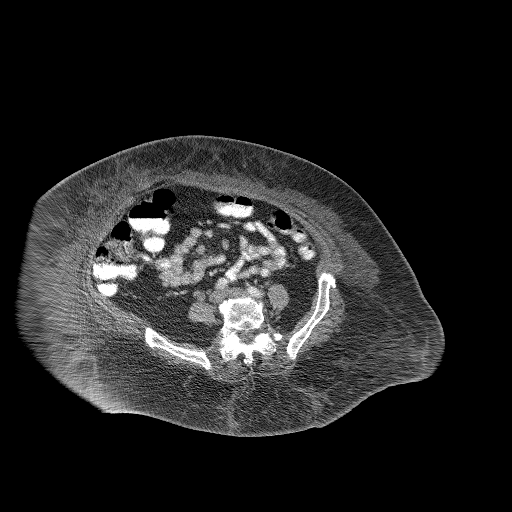
[im 80/159  soft-tissue]
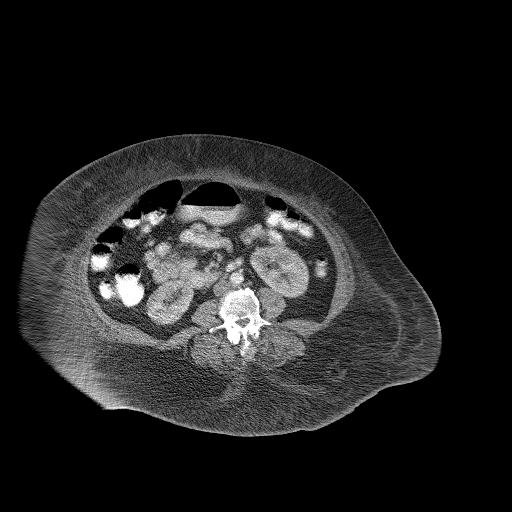
[im 93/159  soft-tissue]
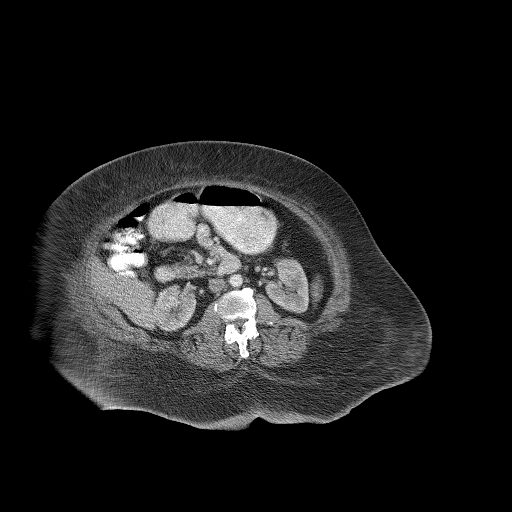
[im 106/159  soft-tissue]
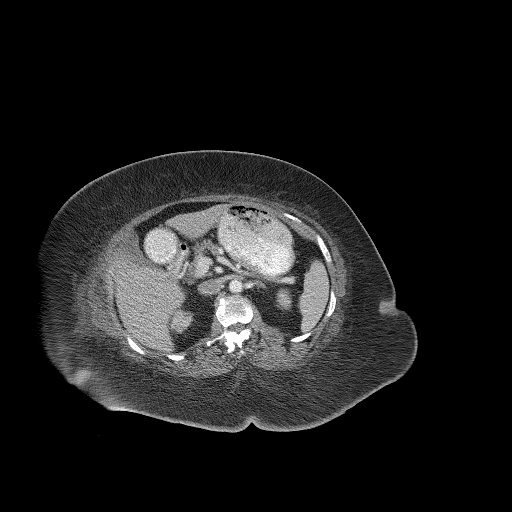
[im 106/159  bone]
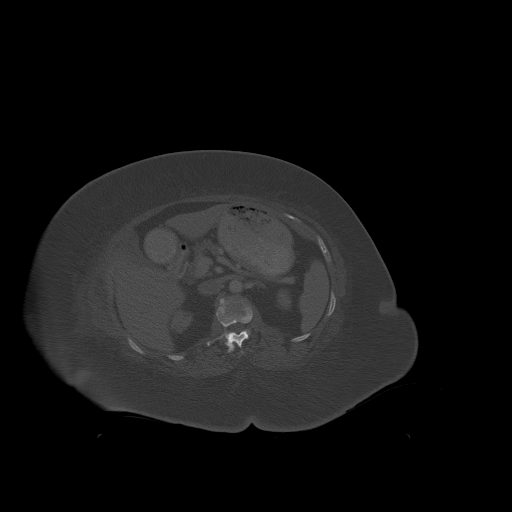
[im 112/159  soft-tissue]
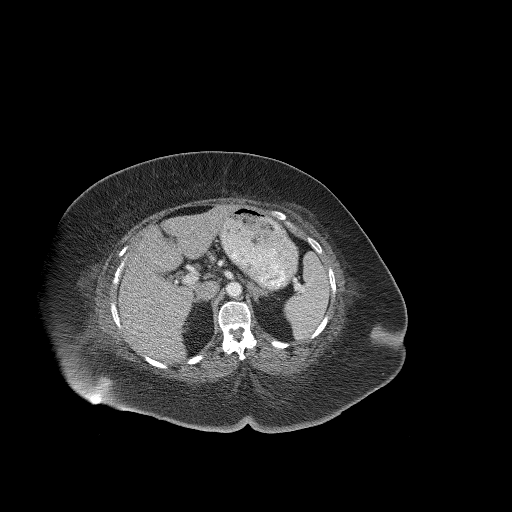
[im 126/159  soft-tissue]
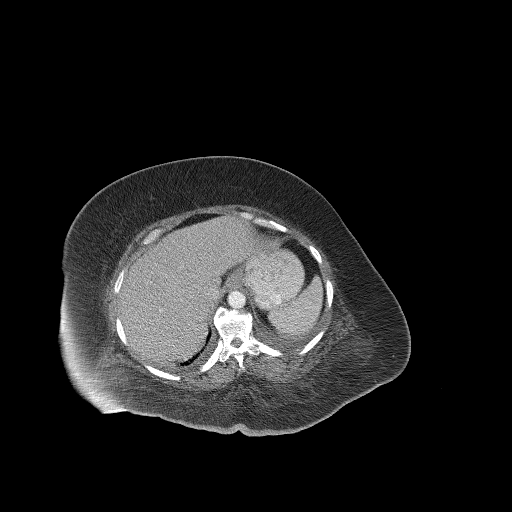
[im 132/159  lung]
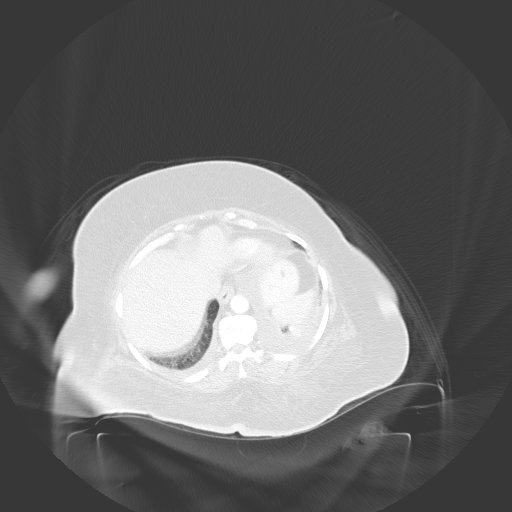
[im 139/159  soft-tissue]
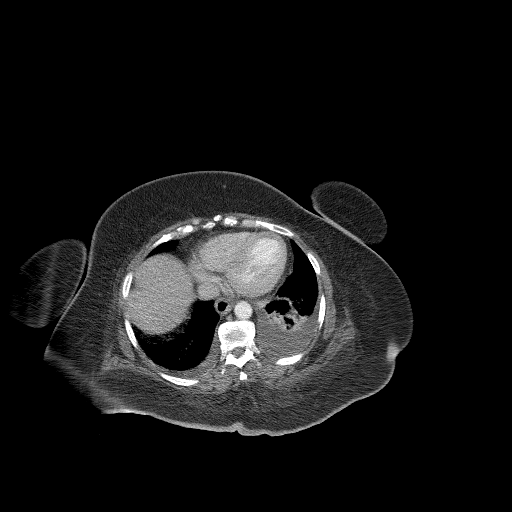
[im 139/159  lung]
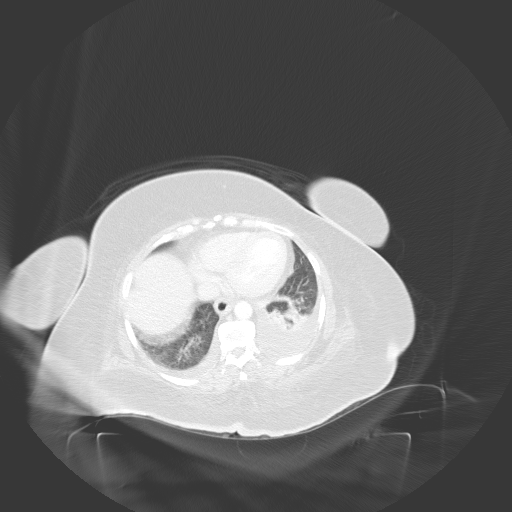
[im 145/159  lung]
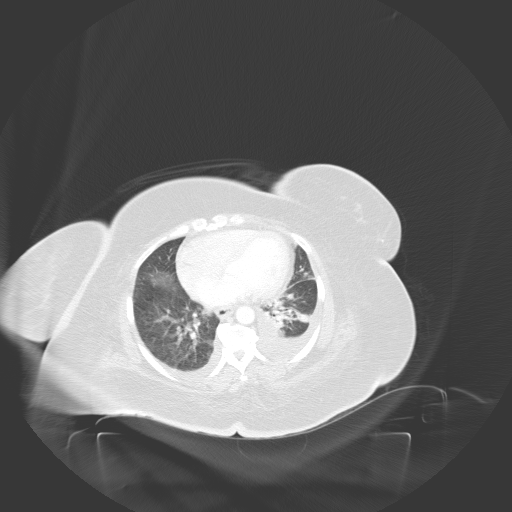
[im 152/159  soft-tissue]
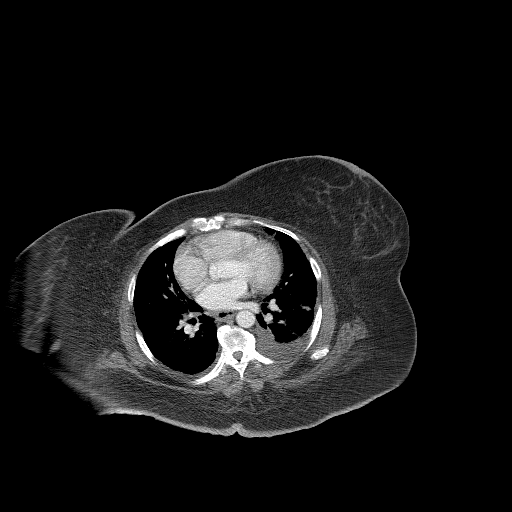
[im 152/159  lung]
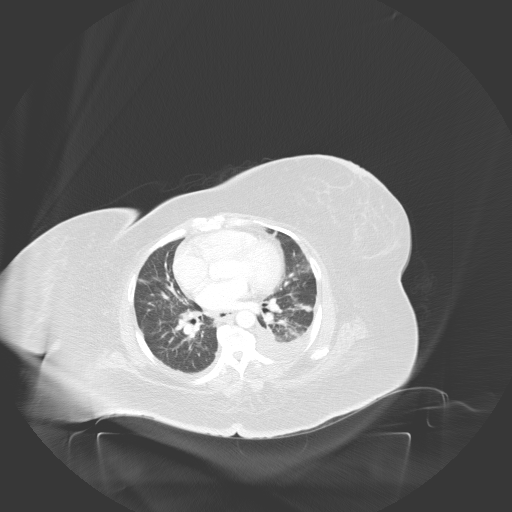

[15 of 32 positions shown; findings below may reference images not displayed]

PROCEDURE:     CT  - CT ABDOMEN / PELVIS  W  - June 13, 2011  [DATE]

RESULT:     The patient was unable to cooperate with breath-holding
techniques. The patient received 100 cc of Ysovue-HDD and also received oral
contrast material. The oral contrast has reached the rectum.

There is a large lower abdominal wall ventral hernia containing loops of the
transverse colon. These loops come within 1 cm of the skin surface. A
discrete draining fistula is not demonstrated. There is no evidence of small
or large bowel obstruction.

The liver exhibits no focal mass or ductal dilation. The gallbladder is
adequately distended with no evidence of stones. The spleen, partially
distended stomach, pancreas, adrenal glands, and kidneys exhibit no acute
abnormality within the limits of the study. There is moderately increased
density in the subcutaneous fat of the abdomen and pelvis diffusely which
may reflect the patient's protein status. I do not see evidence of ascites.
Within the pelvis the uterus is situated to the right of midline. The uterus
is partially distended and grossly normal. I see no adnexal masses.

There is a small right pleural effusion and small to moderate left pleural
effusion with left basilar atelectasis.
IMPRESSION: 1. There is a large lower abdominal and upper pelvic ventral hernia
containing portions of the transverse colon. I do not see evidence of
obstruction. A definite draining sinus tract is not demonstrated.
2. There is a small right pleural effusion and moderate-sized left pleural
effusion with left basilar atelectasis or pneumonia.
3. I do not see acute hepatobiliary abnormality nor acute urinary tract
abnormality.

## 2014-06-02 IMAGING — CR DG CHEST 2V
1 series · 3 of 3 positions shown · non-contrast
Comparison: none

REASON FOR EXAM: wheeze
COMMENTS:

PROCEDURE:     DXR - DXR CHEST PA (OR AP) AND LATERAL  - June 13, 2011 [DATE]
RESULT:     Comparison: 06/10/2011

[Series 1: x chest ap · 0.14mm/px · 3 of 3 slices shown]
[im 1/3]
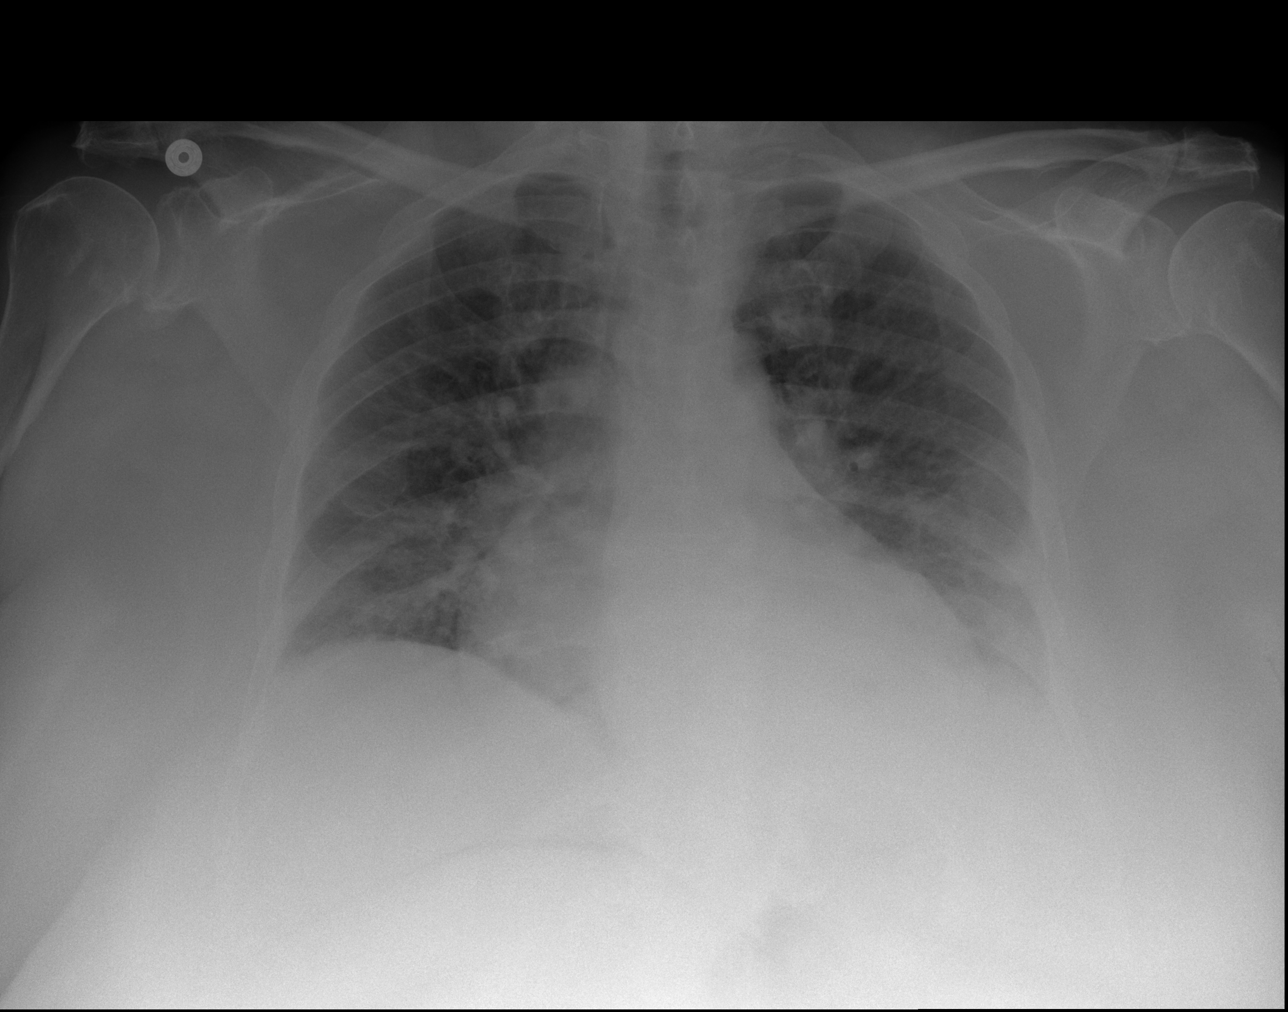
[im 2/3]
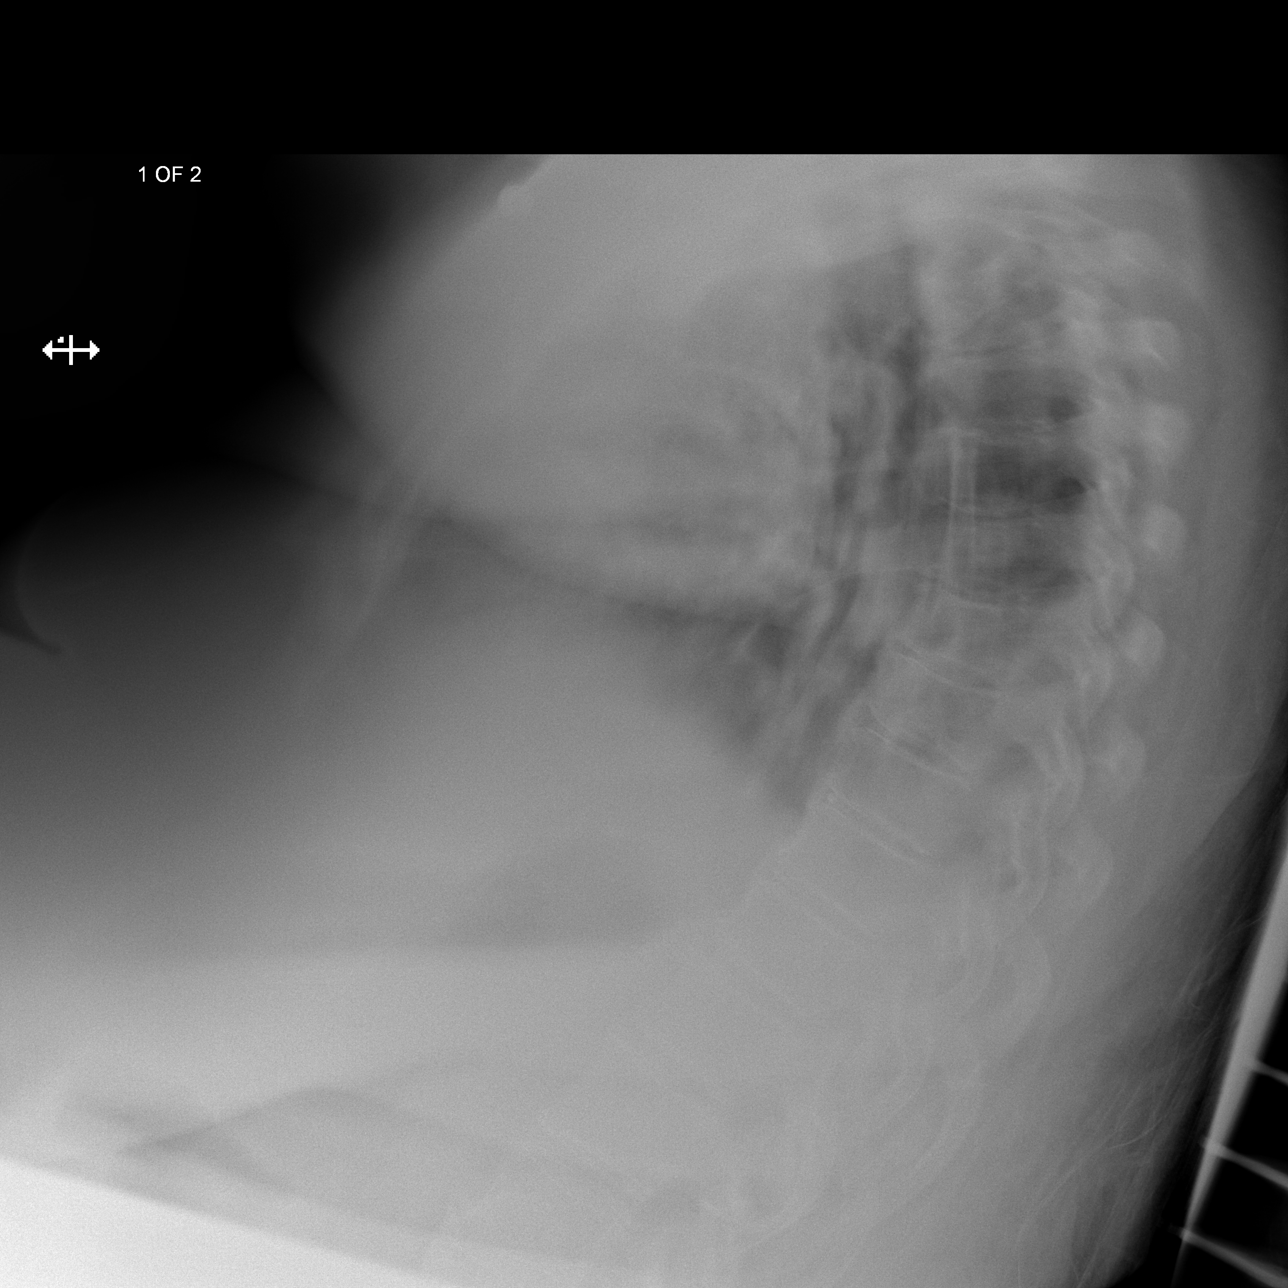
[im 3/3]
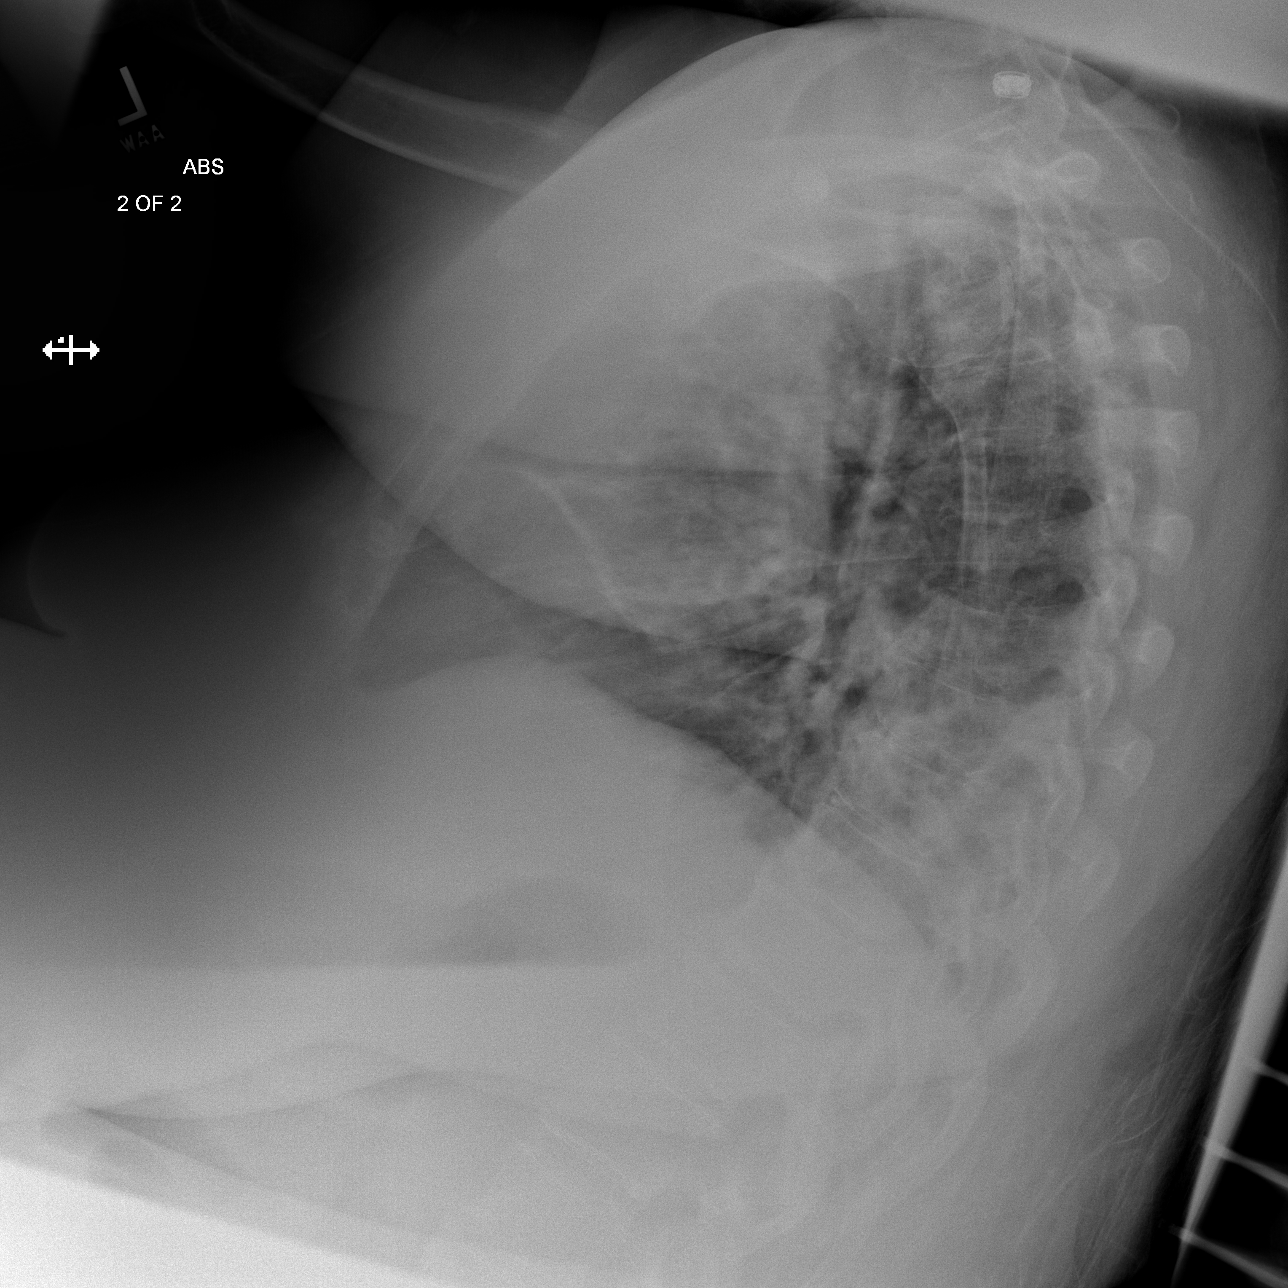

[3 of 3 positions shown; findings below may reference images not displayed]

FINDINGS: PA and lateral chest radiographs are provided. There is bilateral diffuse
interstitial thickening likely representing interstitial edema versus
interstitial pneumonitis secondary to an infectious or inflammatory
etiology. There is no focal parenchymal opacity, pleural effusion, or
pneumothorax. The heart and mediastinum are unremarkable.  The osseous
structures are unremarkable.
IMPRESSION: There is bilateral diffuse interstitial thickening likely representing
interstitial edema versus interstitial pneumonitis secondary to an
infectious or inflammatory etiology.

[REDACTED]
